# Patient Record
Sex: Male | Born: 1960 | Race: White | Hispanic: No | Marital: Married | Smoking: Former smoker
Health system: Southern US, Community
[De-identification: ages and names within clinical notes are randomized; demographics above are authoritative.]

## PROBLEM LIST (undated history)

## (undated) DIAGNOSIS — B1910 Unspecified viral hepatitis B without hepatic coma: Secondary | ICD-10-CM

## (undated) HISTORY — DX: Unspecified viral hepatitis B without hepatic coma: B19.10

---

## 2002-01-17 ENCOUNTER — Encounter: Admission: RE | Admit: 2002-01-17 | Discharge: 2002-01-17 | Payer: Self-pay | Admitting: Sports Medicine

## 2005-02-26 ENCOUNTER — Ambulatory Visit: Payer: Self-pay | Admitting: Family Medicine

## 2006-05-19 DIAGNOSIS — B1910 Unspecified viral hepatitis B without hepatic coma: Secondary | ICD-10-CM

## 2011-01-05 ENCOUNTER — Ambulatory Visit: Payer: Self-pay | Admitting: Family Medicine

## 2011-01-15 ENCOUNTER — Ambulatory Visit: Payer: Self-pay | Admitting: Family Medicine

## 2011-01-20 ENCOUNTER — Ambulatory Visit (INDEPENDENT_AMBULATORY_CARE_PROVIDER_SITE_OTHER): Payer: Medicaid Other | Admitting: Family Medicine

## 2011-01-20 ENCOUNTER — Ambulatory Visit (HOSPITAL_COMMUNITY)
Admission: RE | Admit: 2011-01-20 | Discharge: 2011-01-20 | Disposition: A | Discharge status: Home / Self Care | Payer: Medicaid Other | Source: Ambulatory Visit | Attending: Family Medicine | Admitting: Family Medicine

## 2011-01-20 ENCOUNTER — Ambulatory Visit
Admission: RE | Admit: 2011-01-20 | Discharge: 2011-01-20 | Disposition: A | Discharge status: Home / Self Care | Payer: Medicaid Other | Source: Ambulatory Visit | Attending: Family Medicine | Admitting: Family Medicine

## 2011-01-20 ENCOUNTER — Encounter: Payer: Self-pay | Admitting: Family Medicine

## 2011-01-20 ENCOUNTER — Other Ambulatory Visit: Payer: Self-pay

## 2011-01-20 VITALS — BP 126/93 | HR 103 | Ht 71.0 in | Wt 240.0 lb

## 2011-01-20 DIAGNOSIS — R0789 Other chest pain: Secondary | ICD-10-CM

## 2011-01-20 DIAGNOSIS — B1910 Unspecified viral hepatitis B without hepatic coma: Secondary | ICD-10-CM

## 2011-01-20 DIAGNOSIS — M238X9 Other internal derangements of unspecified knee: Secondary | ICD-10-CM

## 2011-01-20 DIAGNOSIS — E669 Obesity, unspecified: Secondary | ICD-10-CM

## 2011-01-20 DIAGNOSIS — R079 Chest pain, unspecified: Secondary | ICD-10-CM | POA: Insufficient documentation

## 2011-01-20 DIAGNOSIS — Z23 Encounter for immunization: Secondary | ICD-10-CM

## 2011-01-20 DIAGNOSIS — M2350 Chronic instability of knee, unspecified knee: Secondary | ICD-10-CM

## 2011-01-20 NOTE — Assessment & Plan Note (Signed)
His history and exam is consistent with a rate old right anterior cruciate ligament tear. Is not interested in planning the cutting sports therefore I feel that surgical intervention at this time would likely not be helpful. Discussed this finding with the patient who agrees with plan. Once chest pain issue is workup will refer to physical therapy for strengthening exercises. Followup in one month

## 2011-01-20 NOTE — Assessment & Plan Note (Signed)
We will need to work on this issue in the future.

## 2011-01-20 NOTE — Assessment & Plan Note (Signed)
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 I am not sure how accurate this is. Plan to obtain comprehensive metabolic panel and hepatitis B and C virus serology. If evidence of current hepatitis B infection and liver injury will proceed with antiviral therapy and referral to hepatology

## 2011-01-20 NOTE — Patient Instructions (Addendum)
Thank you for coming in today. Schedule a time to come in for the labs.  Come in first thing in the morning only have had water and we will get those fasting labs.  See me in 1 month.  We will make a decision regarding stress test after the labs come back.  Flu shot today.

## 2011-01-20 NOTE — Assessment & Plan Note (Signed)
Obviously concerning for a man in his 58s.   We don't know what his cholesterol is, therefore I am unable to calculate an accurate Framingham risk factor. His chest pain appears to be atypical, and his EKG was normal today. Plan to obtain chest x-ray and fasting lipid. If his Framingham risk factor is high enough we will proceed with stress test. Followup in one month

## 2011-01-20 NOTE — Progress Notes (Signed)
Logan Hale is here to establish with a new doctor to discuss his multiple medical problems.  1) chest tightness and pain.  Noted one year ago with an episode of chest tightness and a productive cough. This lasted 6 weeks and has very mildly remained in a chronic form. He notes will have occasional coughing 3-4 days a week that is sometimes productive. He notes that he'll experience some chest discomfort and tightness especially with range of motion of his arms. He denies any exertional chest pain but does note that he gets dyspneic on exertion now.  He denies any palpitations or substernal exertional chest pain.  2) right knee pain. 1.5 years ago was carrying a heavy load and had a twisting slipping injury to his right knee.  He had some knee swelling following this. He now notes that occasionally his knee feels loose or like it might give way every once in a while. He notes that twisting motions tended to exacerbate his knee.  He denies any chronic pain or swelling. He also denies any locking or catching.  3) hepatitis B: Was diagnosed with hepatitis B while he was in his 35s. At that time and. His life he was heavily drinking. He no longer drinks any feels well otherwise. He would like this investigated further.  PMH reviewed.  ROS as above otherwise neg Medications reviewed.  Exam:  BP 126/93  Pulse 103  Ht 5\' 11"  (1.803 m)  Wt 240 lb (108.863 kg)  BMI 33.47 kg/m2 Gen: Well NAD HEENT: EOMI,  MMM Lungs: CTABL Nl WOB Heart: RRR no MRG Abd: NABS, NT, ND Exts: Non edematous BL  LE, warm and well perfused.  MSK: Right knee normal-appearing. No tenderness to palpation. Negative McMurray's or valgus varus stress. Lachman's is loose compared to other side with no firm endpoint.    EKG: Normal sinus rhythm at 80 beats a minute normal axis QRS ST segment and T-wave

## 2011-01-21 ENCOUNTER — Other Ambulatory Visit: Payer: Medicaid Other

## 2011-01-21 DIAGNOSIS — R0789 Other chest pain: Secondary | ICD-10-CM

## 2011-01-21 DIAGNOSIS — B1910 Unspecified viral hepatitis B without hepatic coma: Secondary | ICD-10-CM

## 2011-01-21 LAB — LIPID PANEL
LDL Cholesterol: 121 mg/dL — ABNORMAL HIGH (ref 0–99)
Total CHOL/HDL Ratio: 5 Ratio
Triglycerides: 109 mg/dL (ref <150)
VLDL: 22 mg/dL (ref 0–40)

## 2011-01-21 LAB — HEPATITIS B SURFACE ANTIGEN: Hepatitis B Surface Ag: NEGATIVE

## 2011-01-21 LAB — HEPATITIS B SURFACE ANTIBODY,QUALITATIVE

## 2011-01-21 NOTE — Progress Notes (Signed)
LABS DONE TODAY Ahmaud Duthie 

## 2011-01-22 LAB — COMPLETE METABOLIC PANEL WITHOUT GFR
ALT: 29 U/L (ref 0–53)
AST: 17 U/L (ref 0–37)
Albumin: 4.1 g/dL (ref 3.5–5.2)
Alkaline Phosphatase: 65 U/L (ref 39–117)
BUN: 11 mg/dL (ref 6–23)
CO2: 25 meq/L (ref 19–32)
Calcium: 9.4 mg/dL (ref 8.4–10.5)
Chloride: 103 meq/L (ref 96–112)
Creat: 1.1 mg/dL (ref 0.50–1.35)
GFR, Est African American: >89 mL/min
GFR, Est Non African American: 78 mL/min — ABNORMAL LOW
Glucose, Bld: 97 mg/dL (ref 70–99)
Potassium: 3.9 meq/L (ref 3.5–5.3)
Sodium: 138 meq/L (ref 135–145)
Total Bilirubin: 0.7 mg/dL (ref 0.3–1.2)
Total Protein: 6.4 g/dL (ref 6.0–8.3)

## 2011-01-22 LAB — HEPATITIS B CORE ANTIBODY, TOTAL: Hep B Core Total Ab: POSITIVE — AB

## 2011-02-15 ENCOUNTER — Encounter: Payer: Self-pay | Admitting: Family Medicine

## 2011-02-15 ENCOUNTER — Ambulatory Visit (INDEPENDENT_AMBULATORY_CARE_PROVIDER_SITE_OTHER): Payer: Medicaid Other | Admitting: Family Medicine

## 2011-02-15 VITALS — BP 120/57 | HR 85 | Ht 71.0 in | Wt 235.0 lb

## 2011-02-15 DIAGNOSIS — R05 Cough: Secondary | ICD-10-CM

## 2011-02-15 DIAGNOSIS — R0789 Other chest pain: Secondary | ICD-10-CM

## 2011-02-15 DIAGNOSIS — R053 Chronic cough: Secondary | ICD-10-CM

## 2011-02-15 MED ORDER — ALBUTEROL SULFATE HFA 108 (90 BASE) MCG/ACT IN AERS: 2.0000 | INHALATION_SPRAY | Freq: Four times a day (QID) | RESPIRATORY_TRACT | Status: AC | PRN

## 2011-02-15 NOTE — Assessment & Plan Note (Signed)
Associated with some shortness of breath. I think it's possible he may have some reactive airway disease. I plan to initiate a trial of albuterol to see if it helps. If no aid I feel further cardiology workup will be beneficial. We'll review how often he is using albuterol to determine if inhaled corticosteroids as indicated.

## 2011-02-15 NOTE — Assessment & Plan Note (Signed)
I think Logan Hale would benefit from further risk factor stratification.  I'm not sure the best test ordered this case. I don't think he'll be able to give a diagnostic exercise EKG. Therefore I feel that referral to cardiology may make sense in this case. When he benefit from nuclear stress test or exercise echo?  I will follow this up after cardiology visit on patient follows up with me in 3 months.

## 2011-02-15 NOTE — Progress Notes (Signed)
Mr. Jaggers presents to clinic today to followup his chest pain and his shortness of breath and cough.  1) chest pain: Occasionally sharp across his chest with arm motion. Not exertional and no palpitations. Determined at the last visit to be likely nonanginal and atypical. In the interim he has had some episodes of this but no change in type. He feels well otherwise  2) shortness of breath on exertion.  This has been going on for years. It is not associated with his chest pain. It is sometimes associated with a cough. He also notes it may be worse in the cold weather. No leg swelling. He feels well otherwise.  PMH reviewed.  ROS as above otherwise neg Medications reviewed. Current Outpatient Prescriptions  Medication Sig Dispense Refill  . albuterol (PROVENTIL HFA) 108 (90 BASE) MCG/ACT inhaler Inhale 2 puffs into the lungs every 6 (six) hours as needed for wheezing.  1 Inhaler  1    Exam:  BP 120/57  Pulse 85  Ht 5\' 11"  (1.803 m)  Wt 235 lb (106.595 kg)  BMI 32.78 kg/m2 Gen: Well NAD HEENT: EOMI,  MMM, no JVD Lungs: CTABL Nl WOB Heart: RRR no MRG Abd: NABS, NT, ND Exts: Non edematous BL  LE, warm and well perfused.

## 2011-02-15 NOTE — Patient Instructions (Signed)
Thank you for coming in today. I am having you see a cardiologist as they can order the best test for you.  Please see me in 3 months or sooner if you have problems.  I am also trying you on an albuterol inhaler to see if that helps with a cough in the cold weather.

## 2011-02-17 ENCOUNTER — Telehealth: Payer: Self-pay | Admitting: *Deleted

## 2011-02-17 NOTE — Telephone Encounter (Signed)
Left message for patient to return call. Please tell him he has an appointment with Dr Morene [facility] on 03/05/11 at 3:30. If he cannot keep this appointment he needs to call [phone] to reschedule.Busick, Rodena Medin

## 2011-02-19 ENCOUNTER — Ambulatory Visit: Payer: Medicaid Other | Admitting: Family Medicine

## 2011-02-22 ENCOUNTER — Encounter: Payer: Self-pay | Admitting: Family Medicine

## 2011-02-22 NOTE — Telephone Encounter (Signed)
Mailed letter to patient with appointment information.Logan Hale  

## 2011-03-05 ENCOUNTER — Encounter: Payer: Self-pay | Admitting: Cardiology

## 2011-03-05 ENCOUNTER — Ambulatory Visit (INDEPENDENT_AMBULATORY_CARE_PROVIDER_SITE_OTHER): Payer: Medicaid Other | Admitting: Cardiology

## 2011-03-05 VITALS — BP 116/84 | HR 68 | Ht 71.0 in | Wt 235.0 lb

## 2011-03-05 DIAGNOSIS — R079 Chest pain, unspecified: Secondary | ICD-10-CM

## 2011-03-05 DIAGNOSIS — R0789 Other chest pain: Secondary | ICD-10-CM

## 2011-03-05 NOTE — Patient Instructions (Addendum)
Your physician has requested that you have en exercise stress myoview. For further information please visit [email or web address] Please follow instruction sheet, as given.  Take aspirin 81mg  daily.  Your physician recommends that you schedule a follow-up appointment in: 3-4 weeks with Dr Shirlee Latch.

## 2011-03-07 NOTE — Assessment & Plan Note (Signed)
Patient has atypical chest tightness.  He also has chronic exertional dyspnea associated with a chronic cough.  It is very possible that this could be due to exertional asthma.  However, I would like to risk stratify him for coronary disease with an ETT-myoview.  He should start ASA 81 mg daily.

## 2011-03-07 NOTE — Progress Notes (Signed)
PCP: Dr. Denyse Amass  50 yo with minimal past history comes for evaluation of exertional dyspnea and chest pain.  Patient has had a chronic cough for about a year that has been possibly attributable to asthma.  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  He is short of breath walking up steps or with other relatively heavy exertion.  He also has noted on and off chest tightness for 6 months.  There is no definite trigger, but it is present every day almost.  The chest pain does not seem to be related to exertion.  He does not smoke.  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    Labs (11/12): K 3.9, creatinine 1.1, LDL 121 HDL 36  PMH: 1. H/o HBV  2. H/o ACL tear 3. Obesity  SH: Married, [employer] representative business, nonsmoker. 5 children.   FH: Father and grandfather both had MIs in their 46s.   ROS: All systems reviewed and negative except as per HPI.   Current Outpatient Prescriptions  Medication Sig Dispense Refill  . albuterol (PROVENTIL HFA) 108 (90 BASE) MCG/ACT inhaler Inhale 2 puffs into the lungs every 6 (six) hours as needed for wheezing.  1 Inhaler  1  . aspirin EC 81 MG tablet Take 1 tablet (81 mg total) by mouth daily.        BP 116/84  Pulse 68  Ht 5\' 11"  (1.803 m)  Wt 106.595 kg (235 lb)  BMI 32.78 kg/m2 General: NAD, overweight Neck: No JVD, no thyromegaly or thyroid nodule.  Lungs: Clear to auscultation bilaterally with normal respiratory effort. CV: Nondisplaced PMI.  Heart regular S1/S2, no S3/S4, no murmur.  No peripheral edema.  No carotid bruit.  Normal pedal pulses.  Abdomen: Soft, nontender, no hepatosplenomegaly, no distention.  Skin: Intact without lesions or rashes.  Neurologic: Alert and oriented x 3.  Psych: Normal affect. Extremities: No clubbing or cyanosis.  HEENT: Normal.

## 2011-03-11 ENCOUNTER — Ambulatory Visit (HOSPITAL_COMMUNITY): Payer: Medicaid Other | Attending: Cardiology | Admitting: Radiology

## 2011-03-11 VITALS — BP 105/75 | Ht 71.0 in | Wt 234.0 lb

## 2011-03-11 DIAGNOSIS — R000 Tachycardia, unspecified: Secondary | ICD-10-CM | POA: Insufficient documentation

## 2011-03-11 DIAGNOSIS — Z8249 Family history of ischemic heart disease and other diseases of the circulatory system: Secondary | ICD-10-CM | POA: Insufficient documentation

## 2011-03-11 DIAGNOSIS — R0602 Shortness of breath: Secondary | ICD-10-CM

## 2011-03-11 DIAGNOSIS — R5383 Other fatigue: Secondary | ICD-10-CM | POA: Insufficient documentation

## 2011-03-11 DIAGNOSIS — R0609 Other forms of dyspnea: Secondary | ICD-10-CM | POA: Insufficient documentation

## 2011-03-11 DIAGNOSIS — R079 Chest pain, unspecified: Secondary | ICD-10-CM | POA: Insufficient documentation

## 2011-03-11 DIAGNOSIS — R0989 Other specified symptoms and signs involving the circulatory and respiratory systems: Secondary | ICD-10-CM | POA: Insufficient documentation

## 2011-03-11 DIAGNOSIS — R5381 Other malaise: Secondary | ICD-10-CM | POA: Insufficient documentation

## 2011-03-11 MED ORDER — TECHNETIUM TC 99M TETROFOSMIN IV KIT
30.0000 | PACK | Freq: Once | INTRAVENOUS | Status: AC | PRN
  Administered 2011-03-11: 30 via INTRAVENOUS

## 2011-03-11 MED ORDER — TECHNETIUM TC 99M TETROFOSMIN IV KIT
10.0000 | PACK | Freq: Once | INTRAVENOUS | Status: AC | PRN
  Administered 2011-03-11: 10 via INTRAVENOUS

## 2011-03-11 NOTE — Progress Notes (Addendum)
[facility] SITE 3 NUCLEAR MED [address] [place] [address] [phone]  Cardiology Nuclear Med Study  Logan Hale is a 50 y.o. male [id number] [birth year]   Nuclear Med Background Indication for Stress Test:  Evaluation for Ischemia History:  No previous documented CAD Cardiac Risk Factors: Family History - CAD  Symptoms:  Chest Pain, Chest Pain (last date of chest discomfort this AM), Chest Tightness, DOE, Fatigue and Rapid HR   Nuclear Pre-Procedure Caffeine/Decaff Intake:  None NPO After: 8:30pm   Lungs:  Clear IV 0.9% NS with Angio Cath:  20g  IV Site: R Antecubital  IV Started by:  Stanton Kidney, EMT-P  Chest Size (in):  46 Cup Size: n/a  Height: 5\' 11"  (1.803 m)  Weight:  234 lb (106.142 kg)  BMI:  Body mass index is 32.64 kg/(m^2). Tech Comments:  NA    Nuclear Med Study 1 or 2 day study: 1 day  Stress Test Type:  Stress  Reading MD: Olga Millers, MD  Order Authorizing Provider: Marca Ancona, MD  Resting Radionuclide: Technetium 54m Tetrofosmin  Resting Radionuclide Dose: 11 mCi   Stress Radionuclide:  Technetium 8m Tetrofosmin  Stress Radionuclide Dose: 33 mCi           Stress Protocol Rest HR: 69 Stress HR: 160  Rest BP: 105/75 Stress BP: 145/71  Exercise Time (min): 8:00 METS: 10.1   Predicted Max HR: 170 bpm % Max HR: 94.12 bpm Rate Pressure Product: 21308   Dose of Adenosine (mg):  n/a Dose of Lexiscan: n/a mg  Dose of Atropine (mg): n/a Dose of Dobutamine: n/a mcg/kg/min (at max HR)  Stress Test Technologist: Bonnita Levan, RN  Nuclear Technologist:  Domenic Polite, CNMT     Rest Procedure:  Myocardial perfusion imaging was performed at rest 45 minutes following the intravenous administration of Technetium 72m Tetrofosmin. Rest ECG: NSR  Stress Procedure:  The patient exercised for 8 minutes.  The patient stopped due to fatigue, dyspnea and CP(1/10). The chest pain resolved immediately in recovery.  There were no  significant ST-T wave changes.  Technetium 58m Tetrofosmin was injected at peak exercise and myocardial perfusion imaging was performed after a brief delay. Stress ECG: No significant ST segment change suggestive of ischemia.  QPS Raw Data Images:  Acquisition technically good; normal left ventricular size. Stress Images:  Normal homogeneous uptake in all areas of the myocardium. Rest Images:  Normal homogeneous uptake in all areas of the myocardium. Subtraction (SDS):  No evidence of ischemia. Transient Ischemic Dilatation (Normal <1.22):  1.04 Lung/Heart Ratio (Normal <0.45):  .39   Quantitative Gated Spect Images QGS EDV:  87 ml QGS ESV:  36 ml QGS cine images:  NL LV Function; NL Wall Motion QGS EF: 59%  Impression Exercise Capacity:  Fair exercise capacity. BP Response:  Normal blood pressure response. Clinical Symptoms:  There is chest pain. ECG Impression:  No significant ST segment change suggestive of ischemia. Comparison with Prior Nuclear Study: No images to compare  Overall Impression:  Normal stress nuclear study.   Olga Millers   Normal study.  Please inform patient.   [employer]

## 2011-03-12 NOTE — Progress Notes (Signed)
Pt was notified.  

## 2013-04-13 ENCOUNTER — Ambulatory Visit: Payer: Medicaid Other

## 2014-04-17 ENCOUNTER — Encounter: Payer: Self-pay | Admitting: Cardiology

## 2015-08-21 ENCOUNTER — Ambulatory Visit (INDEPENDENT_AMBULATORY_CARE_PROVIDER_SITE_OTHER): Payer: Medicaid Other | Admitting: Family Medicine

## 2015-08-21 ENCOUNTER — Encounter: Payer: Self-pay | Admitting: Family Medicine

## 2015-08-21 VITALS — BP 112/82 | HR 85 | Temp 98.1°F | Ht 71.0 in | Wt 238.6 lb

## 2015-08-21 DIAGNOSIS — B169 Acute hepatitis B without delta-agent and without hepatic coma: Secondary | ICD-10-CM

## 2015-08-21 DIAGNOSIS — E669 Obesity, unspecified: Secondary | ICD-10-CM | POA: Diagnosis not present

## 2015-08-21 DIAGNOSIS — Z114 Encounter for screening for human immunodeficiency virus [HIV]: Secondary | ICD-10-CM

## 2015-08-21 DIAGNOSIS — Z1211 Encounter for screening for malignant neoplasm of colon: Secondary | ICD-10-CM | POA: Diagnosis not present

## 2015-08-21 DIAGNOSIS — B1910 Unspecified viral hepatitis B without hepatic coma: Secondary | ICD-10-CM

## 2015-08-21 DIAGNOSIS — M25511 Pain in right shoulder: Secondary | ICD-10-CM

## 2015-08-21 LAB — LIPID PANEL
CHOL/HDL RATIO: 4.1 ratio (ref <=5.0)
Cholesterol: 193 mg/dL (ref 125–200)
HDL: 47 mg/dL (ref >=40)
LDL Cholesterol: 122 mg/dL (ref <130)
TRIGLYCERIDES: 118 mg/dL (ref <150)
VLDL: 24 mg/dL (ref <30)

## 2015-08-21 LAB — CBC
HEMATOCRIT: 47.3 % (ref 38.5–50.0)
HEMOGLOBIN: 16.1 g/dL (ref 13.2–17.1)
MCH: 28.3 pg (ref 27.0–33.0)
MCHC: 34 g/dL (ref 32.0–36.0)
MCV: 83.3 fL (ref 80.0–100.0)
MPV: 11 fL (ref 7.5–12.5)
PLATELETS: 264 10*3/uL (ref 140–400)
RBC: 5.68 MIL/uL (ref 4.20–5.80)
RDW: 13.8 % (ref 11.0–15.0)
WBC: 7 10*3/uL (ref 3.8–10.8)

## 2015-08-21 LAB — COMPLETE METABOLIC PANEL WITH GFR
ALT: 17 U/L (ref 9–46)
AST: 14 U/L (ref 10–35)
Albumin: 4.3 g/dL (ref 3.6–5.1)
Alkaline Phosphatase: 65 U/L (ref 40–115)
BUN: 13 mg/dL (ref 7–25)
CALCIUM: 9.6 mg/dL (ref 8.6–10.3)
CHLORIDE: 102 mmol/L (ref 98–110)
CO2: 25 mmol/L (ref 20–31)
CREATININE: 1.22 mg/dL (ref 0.70–1.33)
GFR, EST AFRICAN AMERICAN: 77 mL/min (ref >=60)
GFR, Est Non African American: 67 mL/min (ref >=60)
Glucose, Bld: 89 mg/dL (ref 65–99)
POTASSIUM: 4.5 mmol/L (ref 3.5–5.3)
Sodium: 138 mmol/L (ref 135–146)
Total Bilirubin: 1 mg/dL (ref 0.2–1.2)
Total Protein: 6.9 g/dL (ref 6.1–8.1)

## 2015-08-21 NOTE — Progress Notes (Signed)
   Subjective:    Logan Hale - 55 y.o. male MRN [id number]  Date of birth: 06-Sep-1960  CC shoulder pain   HPI  Logan Hale is here for shoulder pain.  Should pain Occurring for over a 1.5 year ago  He was lifting a tool box and felt a tug when it occurred.  Hasn't improved.  Has done some stretches at home and has had some improvement.  Pain with sleeping on that side.  Denies any prior surgery  No prior injury.  Pain is intermittent but did have pain with a push mower.  Takes ibuprofen only as needed.  He is left handed but uses his right hand for most of his activities.  He Holiday representativeconstruction and remolding.   History of possible Hep B infection:  Had lab work completed and told it was normal.  Denies any problems during that time.  Unsure of how he could of acquired an infection.  Previous history of alcohol abuse.  He hasn't been abusing any illicit drug use since the early 1990's.   PMH: obesity  SH: former smoker and alcohol abuse FH: Dm2  Health Maintenance:  Health Maintenance Due  Topic Date Due  . HIV Screening  10/03/1975  . COLONOSCOPY  10/03/2010  . TETANUS/TDAP  12/21/2011    Review of Systems See HPI     Objective:   Physical Exam BP 112/82 mmHg  Pulse 85  Temp(Src) 98.1 F (36.7 C) (Oral)  Ht 5\' 11"  (1.803 m)  Wt 238 lb 9.6 oz (108.228 kg)  BMI 33.29 kg/m2  SpO2 95% Gen: NAD, alert, cooperative with exam, well-appearing HEENT: NCAT,  supple neck CV: RRR, good S1/S2, no murmur, no edema,  Resp: CTABL, no wheezes, non-labored Skin: no rashes, normal turgor  MSK: normal neck rotation, normal flexion and extension of elbow.  Shoulder: Inspection the right trapezius is smaller in size compared to the left.  Palpation is normal with no tenderness over AC joint or bicipital groove. He has normal flexion.  Abduction is normal and then augments his posture to get full abduction  Normal internal and external  Weakness with empty can on right  compared to left.  No signs of impingement with negative Neer and Hawkin's tests O'Brien much weaker on the right compared to the left  Normal scapular function observed. No painful arc and no drop arm sign. Neurovascularly intact     Assessment & Plan:   Hepatitis B virus infection The results are not conclusive if he has had a resolved Hep B infection or immune or false positive.  - CMP, Hep C  - Hep B surface Ag, Hep B surface AB, Hep b core Ag  Right shoulder pain Significantly weaker on the right with Empty can and O'Brien's  He could have had a complete tear and/or labral pathology  - provided home modalities and thera band.  - referral to Sports medicine for US and consideration of nitro patch and/or other imaging

## 2015-08-21 NOTE — Assessment & Plan Note (Signed)
Significantly weaker on the right with Empty can and O'Brien's  He could have had a complete tear and/or labral pathology  - provided home modalities and thera band.  - referral to Sports medicine for US and consideration of nitro patch and/or other imaging

## 2015-08-21 NOTE — Patient Instructions (Signed)
Thank you for coming in,   I will call or send a letter with the results from today.   I have referred you to sports medicine for your shoulder.   Please bring all of your medications with you to each visit.   Health maintenance items that are due.  Health Maintenance  Topic Date Due  . HIV Screening  10/03/1975  . Colon Cancer Screening  10/03/2010  . Tetanus Vaccine  12/21/2011  . Flu Shot  10/21/2015  .  Hepatitis C: One time screening is recommended by Center for Disease Control  (CDC) for  adults born from [birth year] through 1965.   Completed     Sign up for My Chart to have easy access to your labs results, and communication with your Primary care physician   Please feel free to call with any questions or concerns at any time, at [phone]. --Dr. Jordan LikesSchmitz

## 2015-08-21 NOTE — Assessment & Plan Note (Signed)
The results are not conclusive if he has had a resolved Hep B infection or immune or false positive.  - CMP, Hep C  - Hep B surface Ag, Hep B surface AB, Hep b core Ag

## 2015-08-22 ENCOUNTER — Encounter: Payer: Self-pay | Admitting: Family Medicine

## 2015-08-22 LAB — HEPATITIS B SURFACE ANTIGEN: Hepatitis B Surface Ag: NEGATIVE

## 2015-08-22 LAB — HEPATITIS C ANTIBODY: HCV AB: NEGATIVE

## 2015-08-22 LAB — HIV ANTIBODY (ROUTINE TESTING W REFLEX): HIV: NONREACTIVE

## 2015-08-22 LAB — HEPATITIS B CORE ANTIBODY, TOTAL: HEP B C TOTAL AB: REACTIVE — AB

## 2015-08-22 LAB — HEPATITIS B SURFACE ANTIBODY,QUALITATIVE: Hep B S Ab: NEGATIVE

## 2015-09-09 ENCOUNTER — Encounter: Payer: Self-pay | Admitting: Sports Medicine

## 2015-09-09 ENCOUNTER — Ambulatory Visit
Admission: RE | Admit: 2015-09-09 | Discharge: 2015-09-09 | Disposition: A | Discharge status: Home / Self Care | Payer: Medicaid Other | Source: Ambulatory Visit | Attending: Sports Medicine | Admitting: Sports Medicine

## 2015-09-09 ENCOUNTER — Other Ambulatory Visit: Payer: Self-pay | Admitting: Sports Medicine

## 2015-09-09 ENCOUNTER — Ambulatory Visit (INDEPENDENT_AMBULATORY_CARE_PROVIDER_SITE_OTHER): Payer: Medicaid Other | Admitting: Sports Medicine

## 2015-09-09 VITALS — BP 108/72 | HR 88 | Ht 71.0 in | Wt 238.0 lb

## 2015-09-09 DIAGNOSIS — M25511 Pain in right shoulder: Secondary | ICD-10-CM | POA: Diagnosis not present

## 2015-09-09 DIAGNOSIS — T1590XA Foreign body on external eye, part unspecified, unspecified eye, initial encounter: Secondary | ICD-10-CM

## 2015-09-09 NOTE — Progress Notes (Signed)
   Subjective:    Patient ID: Logan Hale, male    DOB: May 19, 1960, 55 y.o.   MRN: [id number]  HPI chief complaint: Right shoulder pain  Very pleasant left-hand-dominant 55 year old male comes in today at the request of his primary care physician for evaluation of right shoulder pain. Patient injured his shoulder in February 2016. While carrying a 120 pound box in his right hand, he went to raise up his arm while walking up some steps and felt a pop in the right shoulder. He had immediate pain which was tolerable but has progressively gotten worse. In addition to pain he has noticed significant weakness in his right arm. Although he is left handed he states that he has always been stronger in his right arm and since the injury he has noticed significant weakness in the right arm. He was initially getting some pain at night when sleeping on his right side but that has improved. He gets occasional catching and popping in the shoulder which at times is painful. He denies any numbness or tingling currently down the arm into his forearm or fingers. He did not seek medical attention for this injury until a few days ago. He saw his primary care physician who then referred him to our office for evaluation. He denies any prior shoulder surgeries. He has not had any imaging to date.  Past medical history reviewed Medications reviewed Allergies reviewed    Review of Systems    as above Objective:   Physical Exam  Well-developed, well-nourished. No acute distress. Awake alert and oriented 3. Sitting When the exam room  Right shoulder: Full forward flexion. Abduction is to 160. Internal rotation limited to 70. External rotation is limited to 50-60 (passively and actively). There is no tenderness to palpation. No atrophy. No ecchymosis. Patient has 4/5 strength with resisted supraspinatus testing and 4+/5 strength with resisted external rotation. 5/5 strength with subscapularis testing. Pain with  O'Briens. Neurovascularly intact distally.  Left shoulder: Full range of motion. Rotator cuff strength is 5/5. No atrophy. Neurovascularly intact distally.  MSK ultrasound of the right shoulder was performed. This was a limited study concentrating on the supraspinatus and infraspinatus. There is a small hypoechoic area in the superiormost portion of the supraspinatus tendon but the bulk of the tendon appears to be intact with no noticeable atrophy or retraction. Same is true for the infraspinatus tendon. There is a positive mushroom sign at the acromioclavicular joint which can be suggestive of rotator cuff pathology.   X-rays of the right shoulder including AP and axillary views are reviewed. Mild to moderate degenerative changes are seen. Nothing acute.       Assessment & Plan:   Right shoulder pain and weakness worrisome for rotator cuff tear/adhesive capsulitis  I'm surprised that the ultrasound does not show a more significant injury to his rotator cuff. Therefore, I'm going to confirm this with an MRI. There is also the possibility of a significant labral tear that is responsible for some of his symptoms and an MRI will certainly evaluate that more clearly than the ultrasound. This patient definitely has an element of adhesive capsulitis but I think it is secondary to his primary injury. I will call him with the results of the MRI once available at which point we will delineate a more definitive treatment plan. If the MRI does not confirm a significant rotator cuff tear that I would consider merits of an EMG/nerve conduction study to rule out a traumatic brachioplexopathy.

## 2015-09-15 ENCOUNTER — Ambulatory Visit
Admission: RE | Admit: 2015-09-15 | Discharge: 2015-09-15 | Disposition: A | Discharge status: Home / Self Care | Payer: Medicaid Other | Source: Ambulatory Visit | Attending: Sports Medicine | Admitting: Sports Medicine

## 2015-09-15 DIAGNOSIS — M25511 Pain in right shoulder: Secondary | ICD-10-CM

## 2015-09-15 DIAGNOSIS — T1590XA Foreign body on external eye, part unspecified, unspecified eye, initial encounter: Secondary | ICD-10-CM

## 2015-09-22 ENCOUNTER — Encounter: Payer: Self-pay | Admitting: Family Medicine

## 2015-09-26 ENCOUNTER — Other Ambulatory Visit: Payer: Self-pay | Admitting: *Deleted

## 2015-09-26 ENCOUNTER — Telehealth: Payer: Self-pay | Admitting: Sports Medicine

## 2015-09-26 DIAGNOSIS — M25511 Pain in right shoulder: Secondary | ICD-10-CM

## 2015-09-26 NOTE — Telephone Encounter (Signed)
I spoke with the patient on the phone today after reviewing the MRI of his right shoulder. MRI shows severe tendinosis of the supraspinatus tendon with a small bursal sided tear. He also has a SLAP tear. Given his rotator cuff weakness on physical exam, I would not be surprised if he has a larger rotator cuff tear than what the MRI suggests. Based on these findings I've recommended consultation with Dr. Dion SaucierLandau to discuss treatment options. Patient is in agreement with that plan. I will defer further workup and treatment to the discretion of Dr. Dion SaucierLandau and the patient will follow-up with me as needed.

## 2016-09-15 ENCOUNTER — Encounter: Payer: Self-pay | Admitting: Family Medicine

## 2016-09-15 ENCOUNTER — Ambulatory Visit (INDEPENDENT_AMBULATORY_CARE_PROVIDER_SITE_OTHER): Payer: Medicaid Other | Admitting: Family Medicine

## 2016-09-15 ENCOUNTER — Other Ambulatory Visit: Payer: Self-pay | Admitting: Family Medicine

## 2016-09-15 ENCOUNTER — Ambulatory Visit
Admission: RE | Admit: 2016-09-15 | Discharge: 2016-09-15 | Disposition: A | Discharge status: Home / Self Care | Payer: Medicaid Other | Source: Ambulatory Visit | Attending: Family Medicine | Admitting: Family Medicine

## 2016-09-15 VITALS — BP 108/78 | HR 92 | Temp 98.3°F | Ht 71.0 in | Wt 239.6 lb

## 2016-09-15 DIAGNOSIS — R1030 Lower abdominal pain, unspecified: Secondary | ICD-10-CM

## 2016-09-15 LAB — COMPREHENSIVE METABOLIC PANEL
ALBUMIN: 4.2 g/dL (ref 3.5–5.5)
ALK PHOS: 77 IU/L (ref 39–117)
ALT: 24 IU/L (ref 0–44)
AST: 19 IU/L (ref 0–40)
Albumin/Globulin Ratio: 1.8 (ref 1.2–2.2)
BILIRUBIN TOTAL: 1 mg/dL (ref 0.0–1.2)
BUN / CREAT RATIO: 12 (ref 9–20)
BUN: 13 mg/dL (ref 6–24)
CHLORIDE: 101 mmol/L (ref 96–106)
CO2: 21 mmol/L (ref 20–29)
Calcium: 9.2 mg/dL (ref 8.7–10.2)
Creatinine, Ser: 1.07 mg/dL (ref 0.76–1.27)
GFR calc Af Amer: 90 mL/min/{1.73_m2} (ref >59)
GFR calc non Af Amer: 78 mL/min/{1.73_m2} (ref >59)
GLUCOSE: 92 mg/dL (ref 65–99)
Globulin, Total: 2.3 g/dL (ref 1.5–4.5)
POTASSIUM: 4.4 mmol/L (ref 3.5–5.2)
Sodium: 137 mmol/L (ref 134–144)
Total Protein: 6.5 g/dL (ref 6.0–8.5)

## 2016-09-15 MED ORDER — CIPROFLOXACIN HCL 500 MG PO TABS: 500.0000 mg | ORAL_TABLET | Freq: Two times a day (BID) | ORAL | 0 refills | Status: DC

## 2016-09-15 MED ORDER — IOPAMIDOL (ISOVUE-300) INJECTION 61%
125.0000 mL | Freq: Once | INTRAVENOUS | Status: AC | PRN
  Administered 2016-09-15: 125 mL via INTRAVENOUS

## 2016-09-15 MED ORDER — METRONIDAZOLE 500 MG PO TABS: 500.0000 mg | ORAL_TABLET | Freq: Three times a day (TID) | ORAL | 0 refills | Status: DC

## 2016-09-15 NOTE — Progress Notes (Signed)
    CHIEF COMPLAINT / HPI:  3 days worsening crampy abdominal pain. Likely pain will be 10/10 for about 2 or 3 minutes. This usually happens if he changes position such as rolling over in bed or going from a seated to stand. Very little movement can cause this to occur such as shifting in his seat. These episodes are happening in increasing frequency and his wife made him come in today because she was worried. Clammy and feels somewhat feverish but has not checked his temperature. Has not had true sweats and has had no chills. His bowel movements have been more frequent, less bulk. No NV. Denies back pain.   PERTINENT  PMH / PSH: I have reviewed the patient's medications, allergies, past medical and surgical history, smoking status.  Pertinent findings that relate to today's visit / issues include:  Hx hepatitis B No history of appendectomy or other abdominal surgeries Has not yet had his screening colonoscopy. Former smoker, 10 pack years history. No known FH early colon cancer  REVIEW OF SYSTEMS:  See history of present illness above. Additional pertinent review of systems negative for cough, no chest pain, no arthralgias or myalgias. He's had no rash. No problems with urinary stream, no dysuria, no urinary frequency, no scrotal pain or swelling.  OBJECTIVE:  Vital signs are reviewed.  GEN.: Well-developed male no acute distress CV: Regular rate and rhythm LUNGS: Clear to auscultation bilaterally without rales or wheeze NECK: No lymphadenopathy no thyromegaly no JVD.  ABD: ttp below umbilicus with rebound signs his palpation here. He also has rebound in the left and right lower quadrants resulting in pain in the area below his umbilicus.. Bowel sounds present in all quadrants but relatively sparse.  No ascites is noted. No organomegaly is noted that decision Simmons limited by habitus. NEURO: No gross focal neurological deficits. Normal gait. EXTREMITY: Normal muscle bulk and tone,  movement and extremities 4  ASSESSMENT / PLAN: Acute / subacute abdominal pain. Concerning for small bowel obstruction, early diverticulitis, other pathology including neoplasm. No prior history of abdominal surgeries so unclear why he would be at risk for SBO unless this is neoplastic. Given his symptoms I think we are at least in the early stages of SBO. Discussed with him and his wife.  PLAN: CBC, stat CMP, stat CT abdomen and pelvis with contrast. Discussed with him and his wife. I will call with results. CT is at 4 pm today. I will call him this PM with results.

## 2016-09-15 NOTE — Progress Notes (Signed)
Receive CT scan report from radiology. He has sigmoid diverticulitis. I have called the patient and explained this to him. I will start him on Cipro 500 twice a day and Flagyl 500 3 times a day for the next 7-10 days. I asked him to make an appointment here at [facility] for follow-up in the next week. Also explained red flags to watch out for such is sudden increase in pain, nausea vomiting, fever sweats chills or bloody diarrhea. For any of these he would need to seek care immediately. If after hours, I told him to go to the emergency department. I answered all his questions. He is aware of the need for follow-up.

## 2016-09-15 NOTE — Patient Instructions (Addendum)
Get your blood work and CT scan I will talk with you after the scan is resulted (today)    [phone] ([phone] is the phone number I have for you

## 2016-09-16 LAB — CBC WITH DIFFERENTIAL/PLATELET
BASOS: 0 %
Basophils Absolute: 0 10*3/uL (ref 0.0–0.2)
EOS (ABSOLUTE): 0.2 10*3/uL (ref 0.0–0.4)
Eos: 2 %
Hematocrit: 45.5 % (ref 37.5–51.0)
Hemoglobin: 15.4 g/dL (ref 13.0–17.7)
IMMATURE GRANS (ABS): 0.1 10*3/uL (ref 0.0–0.1)
Immature Granulocytes: 1 %
LYMPHS: 13 %
Lymphocytes Absolute: 1.5 10*3/uL (ref 0.7–3.1)
MCH: 28.2 pg (ref 26.6–33.0)
MCHC: 33.8 g/dL (ref 31.5–35.7)
MCV: 83 fL (ref 79–97)
MONOS ABS: 1 10*3/uL — AB (ref 0.1–0.9)
Monocytes: 9 %
Neutrophils Absolute: 8.3 10*3/uL — ABNORMAL HIGH (ref 1.4–7.0)
Neutrophils: 75 %
PLATELETS: 246 10*3/uL (ref 150–379)
RBC: 5.47 x10E6/uL (ref 4.14–5.80)
RDW: 13.4 % (ref 12.3–15.4)
WBC: 11.1 10*3/uL — ABNORMAL HIGH (ref 3.4–10.8)

## 2016-09-17 ENCOUNTER — Telehealth: Payer: Self-pay | Admitting: Family Medicine

## 2016-09-17 NOTE — Telephone Encounter (Signed)
**  After Hours/ Emergency Line Call*  Received a call to report that Hilbert CorriganStephen E Sullenger had one episode of dark urine this evening. The urine was "double as dark as his normal urine". He was calling to see if he should be concerned about this. He was seen in clinic on 6/27 with severe abdominal pain. He had a CT abdomen performed that showed diverticulitis and he was prescribed Cipro and Flagyl. He states that is abdominal pain is feeling much, much better. He "got 50% better the first day on antibiotics and got another 50% better the second day on antibiotics". Patient denies any hematuria or dysuria.   Per up-to-date, Flagyl can cause brown discoloration of the urine. Patient could also be dehydrated. Discussed both of these possibilities with patient and recommended that he drink plenty of water over the next couple of days. Return precautions discussed.  Will forward to PCP.  Hilton SinclairKaty D Darey Hershberger, MD PGY-2, [facility]

## 2016-09-20 ENCOUNTER — Telehealth: Payer: Self-pay | Admitting: Family Medicine

## 2016-09-20 NOTE — Telephone Encounter (Signed)
Wife is calling and would like for the doctor to call since they have some questions that they need to ask. They have an appointment schedule for 09/24/16. Logan Jacobsonjw

## 2016-09-23 NOTE — Progress Notes (Signed)
   Subjective:   Patient ID: Logan Hale    DOB: [birth year], 56 y.o. male   MRN: [id number]  CC: "Diverticulitis follow-up"  HPI: Logan CorriganStephen E Perlman is a 56 y.o. male who presents to clinic today for diverticulitis follow-up. Problems discussed today are as follows:  Diverticulitis: Patient [facility] for symptoms consistent with acute diverticulitis. CT of abdomen consistent with diverticulitis without signs of free air or abscess. Patient prescribed course of Cipro and Flagyl and completed course 2 days ago. Patient denies history of colonoscopy screening. ROS: Denies fevers or chills, nausea or vomiting, abdominal pain, constipation or diarrhea, melena, hematochezia.  History of wheezing: Typically occurs in the winter when exposed to cold weather. Currently employed in [employer] with frequent exposure to the outdoors which seemed to trigger these episodes. No recent issues though patient is without albuterol inhaler. ROS: Denies dyspnea, chest pain, cough.  Complete ROS performed, see HPI for pertinent.  PMFSH: Obesity, HBV. Father h/o DM, heart disease. Smoking status reviewed. Medications reviewed.  Objective:   BP 101/70 (BP Location: Right Arm, Patient Position: Sitting, Cuff Size: Normal)   Pulse 74   Temp 97.6 F (36.4 C) (Oral)   Ht 5\' 11"  (1.803 m)   Wt 236 lb 6.4 oz (107.2 kg)   SpO2 97%   BMI 32.97 kg/m  Vitals and nursing note reviewed.  General: obese, well nourished, well developed, in no acute distress with non-toxic appearance HEENT: normocephalic, atraumatic, moist mucous membranes CV: regular rate and rhythm without murmurs, rubs, or gallops, no lower extremity edema Lungs: clear to auscultation bilaterally with normal work of breathing Abdomen: soft, non-tender, non-distended, no masses or organomegaly palpable, normoactive bowel sounds Skin: warm, dry, no rashes or lesions, cap refill < 2 seconds Extremities: warm and well perfused, normal  tone  Assessment & Plan:   Acute diverticulitis Thank you. Resolved. Treated with Cipro and Flagyl, patient completed course. Asymptomatic. No history of colonoscopy. --Recommend high-fiber diet to patient in Metamucil supplement --Given form for screening colonoscopy  History of wheezing Chronic. Controlled. Has albuterol inhaler though needs refill. --Albuterol refill for 2 inhalers  No orders of the defined types were placed in this encounter.  No orders of the defined types were placed in this encounter.   Durward Parcelavid Tyanna Hach, DO [facility], PGY-2 09/24/2016 12:29 PM

## 2016-09-24 ENCOUNTER — Ambulatory Visit (INDEPENDENT_AMBULATORY_CARE_PROVIDER_SITE_OTHER): Payer: Medicaid Other | Admitting: Family Medicine

## 2016-09-24 ENCOUNTER — Encounter: Payer: Self-pay | Admitting: Family Medicine

## 2016-09-24 DIAGNOSIS — K5792 Diverticulitis of intestine, part unspecified, without perforation or abscess without bleeding: Secondary | ICD-10-CM

## 2016-09-24 DIAGNOSIS — R053 Chronic cough: Secondary | ICD-10-CM

## 2016-09-24 DIAGNOSIS — Z87898 Personal history of other specified conditions: Secondary | ICD-10-CM | POA: Diagnosis not present

## 2016-09-24 DIAGNOSIS — R05 Cough: Secondary | ICD-10-CM | POA: Diagnosis present

## 2016-09-24 NOTE — Assessment & Plan Note (Signed)
Chronic. Controlled. Has albuterol inhaler though needs refill. --Albuterol refill for 2 inhalers

## 2016-09-24 NOTE — Patient Instructions (Addendum)
Thank you for coming in to see Logan Hale today. Please see below to review our plan for today's visit.  1. To help prevent recurrence of your diverticulitis, make sure you keep a high fiber diet. You do not have to cut out red meats or greasy foods but cut down on frequency of eating these. 2. Have given you a form to call and schedule a screening colonoscopy. Please inform them of your recent diverticulitis episode. 3. I have sent in a prescription refill for your albuterol inhaler. Please keep on on you at all times in case of emergency.  Return to clinic 6 months.  Please call the clinic at [phone] if your symptoms worsen or you have any concerns. It was my pleasure to see you. -- Durward Parcelavid Kiyon Fidalgo, DO [facility], PGY-2   High-Fiber Diet Fiber, also called dietary fiber, is a type of carbohydrate found in fruits, vegetables, whole grains, and beans. A high-fiber diet can have many health benefits. Your health care provider may recommend a high-fiber diet to help:  Prevent constipation. Fiber can make your bowel movements more regular.  Lower your cholesterol.  Relieve hemorrhoids, uncomplicated diverticulosis, or irritable bowel syndrome.  Prevent overeating as part of a weight-loss plan.  Prevent heart disease, type 2 diabetes, and certain cancers.  What is my plan? The recommended daily intake of fiber includes:  38 grams for men under age 56.  30 grams for men over age 56.  25 grams for women under age 56.  21 grams for women over age 650.  You can get the recommended daily intake of dietary fiber by eating a variety of fruits, vegetables, grains, and beans. Your health care provider may also recommend a fiber supplement if it is not possible to get enough fiber through your diet. What do I need to know about a high-fiber diet?  Fiber supplements have not been widely studied for their effectiveness, so it is better to get fiber through food  sources.  Always check the fiber content on thenutrition facts label of any prepackaged food. Look for foods that contain at least 5 grams of fiber per serving.  Ask your dietitian if you have questions about specific foods that are related to your condition, especially if those foods are not listed in the following section.  Increase your daily fiber consumption gradually. Increasing your intake of dietary fiber too quickly may cause bloating, cramping, or gas.  Drink plenty of water. Water helps you to digest fiber. What foods can I eat? Grains Whole-grain breads. Multigrain cereal. Oats and oatmeal. Brown rice. Barley. Bulgur wheat. Millet. Bran muffins. Popcorn. Rye wafer crackers. Vegetables Sweet potatoes. Spinach. Kale. Artichokes. Cabbage. Broccoli. Green peas. Carrots. Squash. Fruits Berries. Pears. Apples. Oranges. Avocados. Prunes and raisins. Dried figs. Meats and Other Protein Sources Navy, kidney, pinto, and soy beans. Split peas. Lentils. Nuts and seeds. Dairy Fiber-fortified yogurt. Beverages Fiber-fortified soy milk. Fiber-fortified orange juice. Other Fiber bars. The items listed above may not be a complete list of recommended foods or beverages. Contact your dietitian for more options. What foods are not recommended? Grains White bread. Pasta made with refined flour. White rice. Vegetables Fried potatoes. Canned vegetables. Well-cooked vegetables. Fruits Fruit juice. Cooked, strained fruit. Meats and Other Protein Sources Fatty cuts of meat. Fried Environmental education officerpoultry or fried fish. Dairy Milk. Yogurt. Cream cheese. Sour cream. Beverages Soft drinks. Other Cakes and pastries. Butter and oils. The items listed above may not be a complete list of foods  and beverages to avoid. Contact your dietitian for more information. What are some tips for including high-fiber foods in my diet?  Eat a wide variety of high-fiber foods.  Make sure that half of all grains consumed  each day are whole grains.  Replace breads and cereals made from refined flour or white flour with whole-grain breads and cereals.  Replace white rice with brown rice, bulgur wheat, or millet.  Start the day with a breakfast that is high in fiber, such as a cereal that contains at least 5 grams of fiber per serving.  Use beans in place of meat in soups, salads, or pasta.  Eat high-fiber snacks, such as berries, raw vegetables, nuts, or popcorn. This information is not intended to replace advice given to you by your health care provider. Make sure you discuss any questions you have with your health care provider. Document Released: 03/08/2005 Document Revised: 08/14/2015 Document Reviewed: 08/21/2013 Elsevier Interactive Patient Education  2017 [employer].

## 2016-09-24 NOTE — Assessment & Plan Note (Addendum)
Thank you. Resolved. Treated with Cipro and Flagyl, patient completed course. Asymptomatic. No history of colonoscopy. --Recommend high-fiber diet to patient in Metamucil supplement --Given form for screening colonoscopy --RTC in 6 months or sooner if needed

## 2017-09-27 IMAGING — CT CT ABD-PELV W/ CM
1 of 3 series · 13 of 32 positions shown, 18 images · IV contrast (APPLIED)
Comparison: None.

CLINICAL DATA: Several days of lower abdominal pain and fever.

EXAM:
CT ABDOMEN AND PELVIS WITH CONTRAST
TECHNIQUE: Multidetector CT imaging of the abdomen and pelvis was performed
using the standard protocol following bolus administration of
intravenous contrast.
CONTRAST:  125mL IIGWYB-ERR IOPAMIDOL (IIGWYB-ERR) INJECTION 61%

[Series 2: abd/pelvis w/cm · axial · 0.89mm/px · z∈[-513,-53]mm · 13 of 104 slices shown, 18 images]
[im 6/104  soft-tissue]
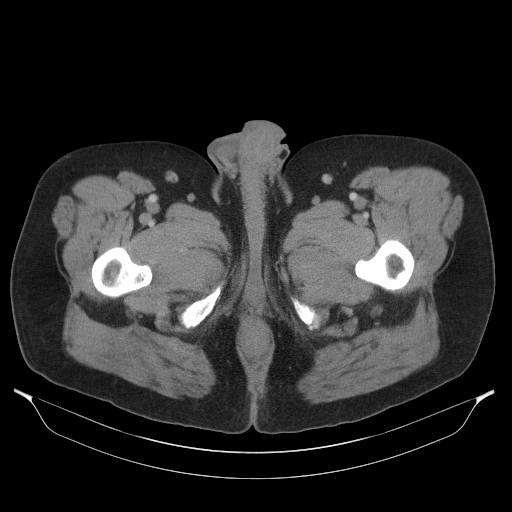
[im 6/104  bone]
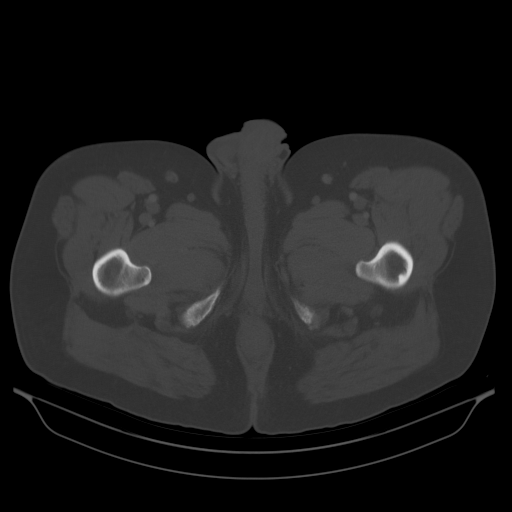
[im 18/104  soft-tissue]
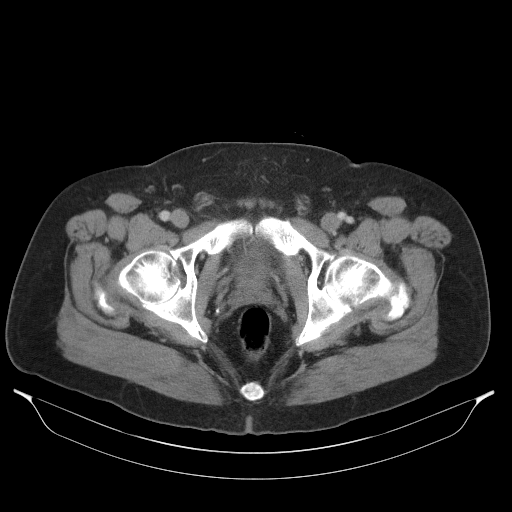
[im 23/104  soft-tissue]
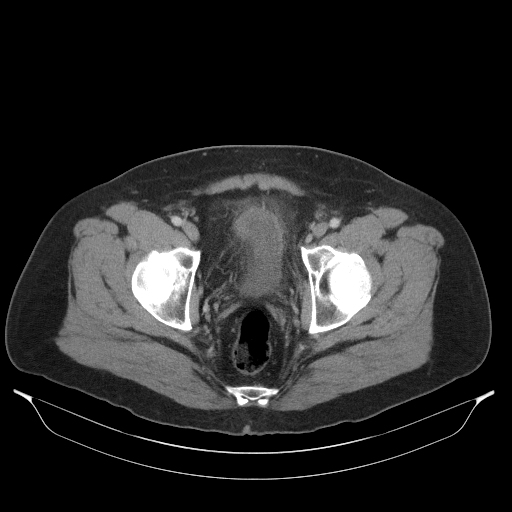
[im 29/104  soft-tissue]
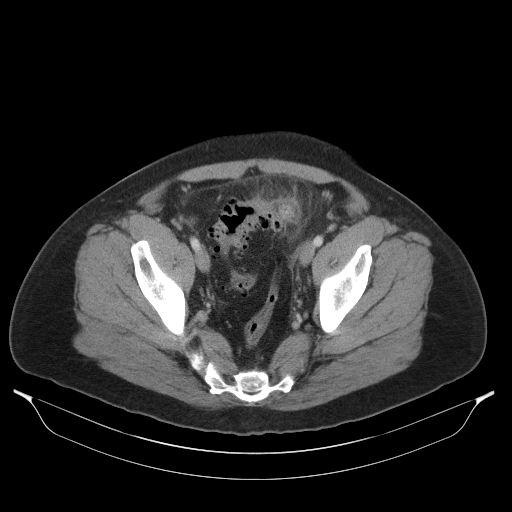
[im 41/104  soft-tissue]
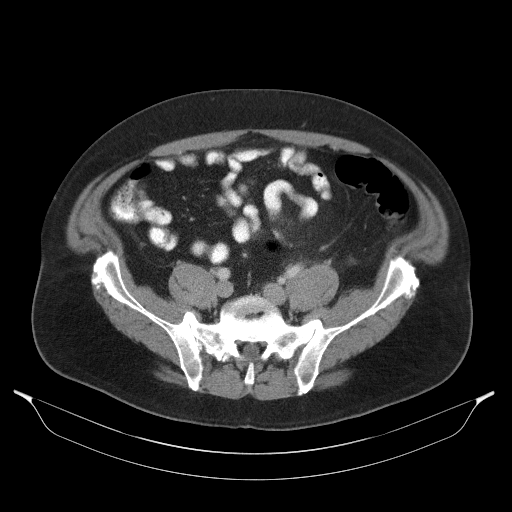
[im 46/104  soft-tissue]
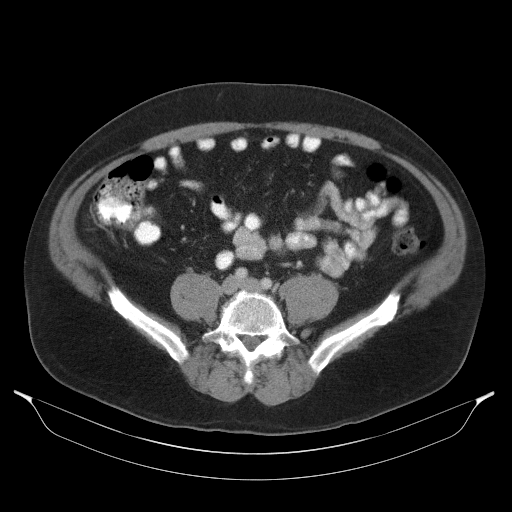
[im 58/104  soft-tissue]
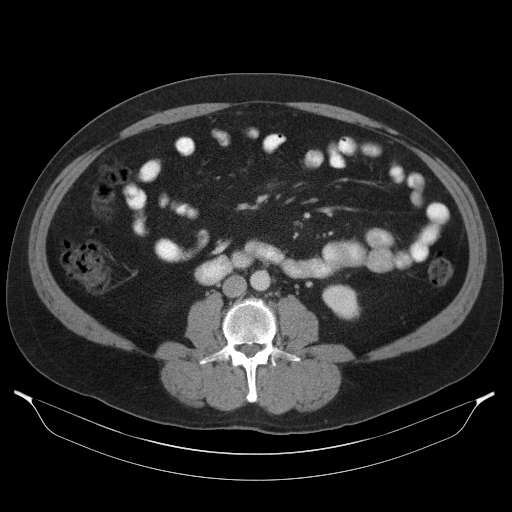
[im 63/104  soft-tissue]
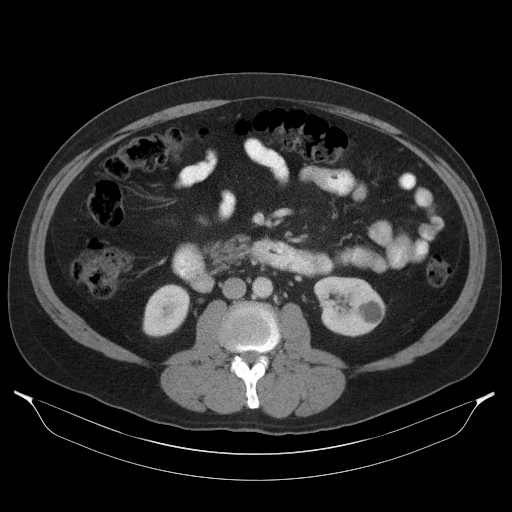
[im 75/104  soft-tissue]
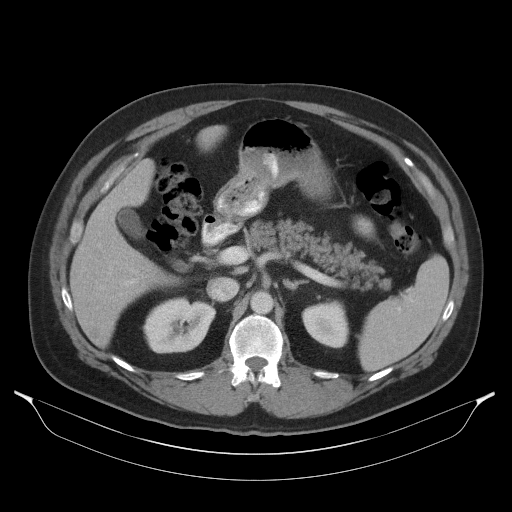
[im 75/104  bone]
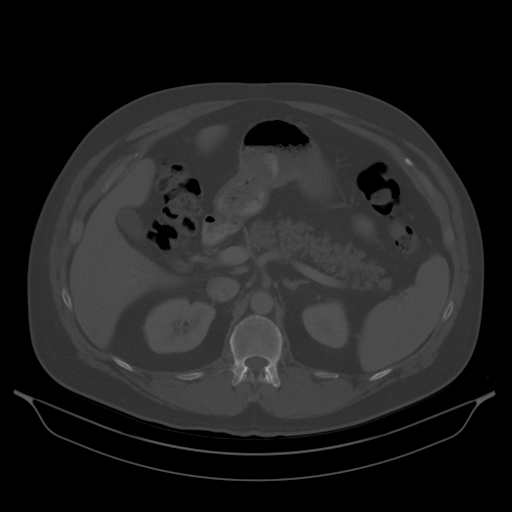
[im 81/104  soft-tissue]
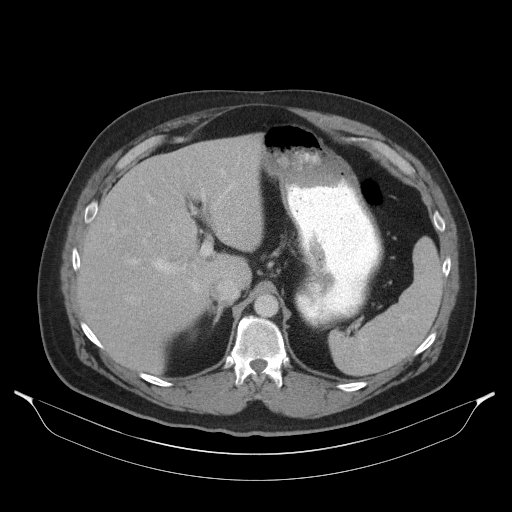
[im 81/104  lung]
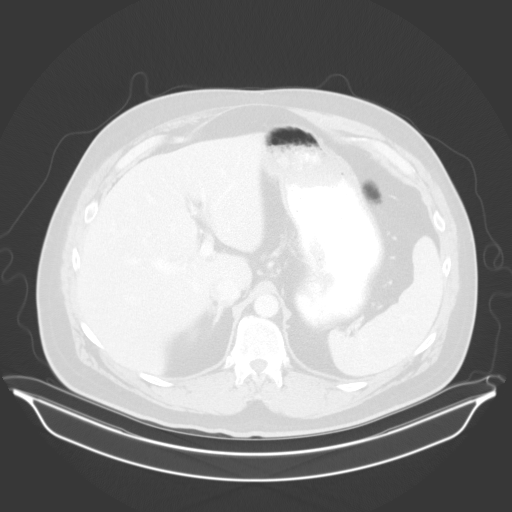
[im 86/104  soft-tissue]
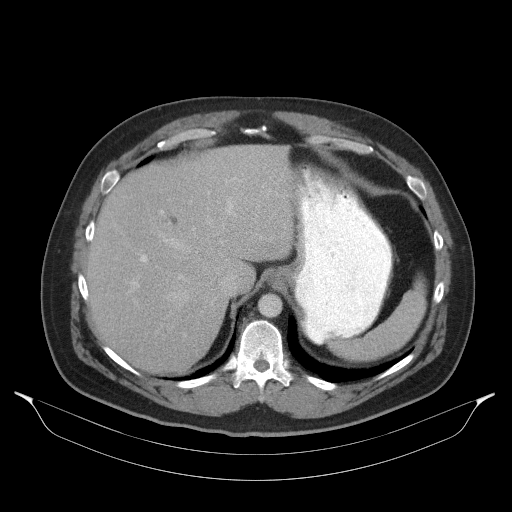
[im 86/104  lung]
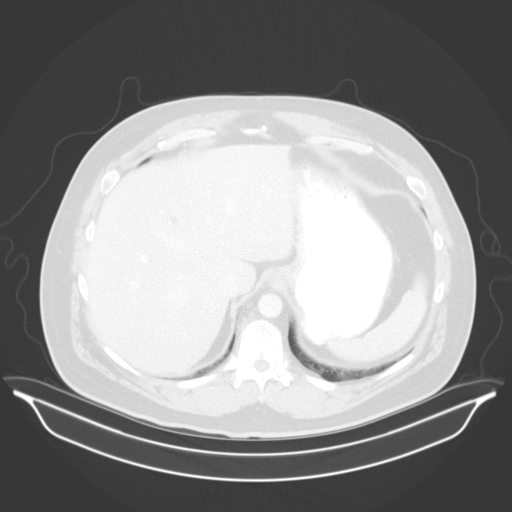
[im 92/104  lung]
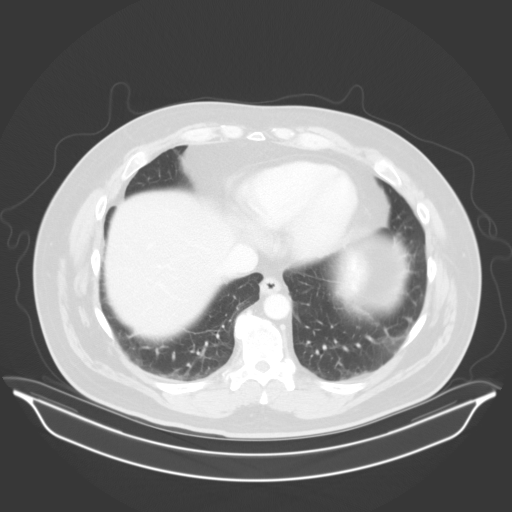
[im 98/104  soft-tissue]
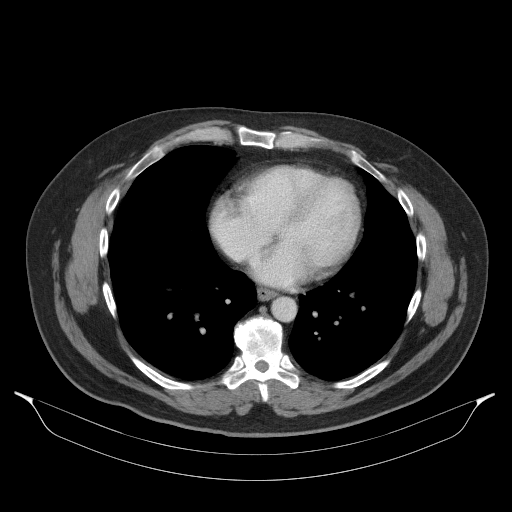
[im 98/104  lung]
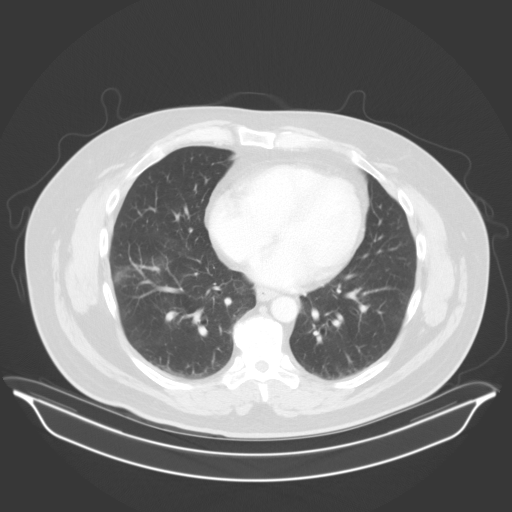

[13 of 32 positions shown; findings below may reference images not displayed]

FINDINGS: Lower chest: No significant pulmonary nodules or acute consolidative
airspace disease.

Hepatobiliary: Normal liver size. Simple 1.4 cm posterior lower
right liver cyst. Several scattered subcentimeter hypodense liver
lesions, too small to characterize, for which no further follow-up
is required unless the patient has risk factors for liver
malignancy. Normal gallbladder with no radiopaque cholelithiasis. No
biliary ductal dilatation.

Pancreas: Heterogeneous diffuse fatty infiltration of the pancreas,
with no discrete pancreatic mass or duct dilation.

Spleen: Normal size. No mass.

Adrenals/Urinary Tract: Normal adrenals. Simple 2.0 cm renal cyst in
the lateral lower left kidney. Otherwise normal kidneys, with no
hydronephrosis. Collapsed bladder. Suggestion of mild reactive
bladder wall thickening in the anterior bladder wall.

Stomach/Bowel: Grossly normal stomach. Normal caliber small bowel
with no small bowel wall thickening. Normal appendix. Marked sigmoid
diverticulosis. There is prominent focal eccentric wall thickening
and hyperenhancement in the mid sigmoid colon with associated thick
walled diverticulum and prominent pericolonic fat stranding and
ill-defined fluid, compatible with acute sigmoid diverticulitis. No
pericolonic free air or focal fluid collection. No additional sites
of large bowel wall thickening. Oral contrast progresses to the
cecum.

Vascular/Lymphatic: Normal caliber abdominal aorta. Patent portal,
splenic, hepatic and renal veins. No pathologically enlarged lymph
nodes in the abdomen or pelvis.

Reproductive: Normal size prostate with nonspecific internal
prostatic calcifications.

Other: No pneumoperitoneum, ascites or focal fluid collection.

Musculoskeletal: No aggressive appearing focal osseous lesions.
Asymmetric patchy sclerosis in the medial right iliac bone is
nonspecific, possibly degenerative. Moderate thoracolumbar
spondylosis.
IMPRESSION: Acute diverticulitis of the mid sigmoid colon. Prominent pericolonic
inflammatory changes with reactive inflammation in the adjacent
bladder wall. No free air or abscess at this time.

These results will be called to the ordering clinician or
representative by the Radiologist Assistant, and communication
documented in the PACS or zVision Dashboard.
